# Patient Record
Sex: Male | Born: 1995 | Hispanic: Yes | Marital: Single | Smoking: Never smoker
Health system: Southern US, Community
[De-identification: ages and names within clinical notes are randomized; demographics above are authoritative.]

---

## 2019-09-12 ENCOUNTER — Emergency Department (HOSPITAL_COMMUNITY)
Admission: EM | Admit: 2019-09-12 | Discharge: 2019-09-12 | Disposition: A | Discharge status: Home / Self Care | Payer: Self-pay | Attending: Emergency Medicine | Admitting: Emergency Medicine

## 2019-09-12 ENCOUNTER — Other Ambulatory Visit: Payer: Self-pay

## 2019-09-12 ENCOUNTER — Encounter (HOSPITAL_COMMUNITY): Payer: Self-pay | Admitting: Emergency Medicine

## 2019-09-12 DIAGNOSIS — B081 Molluscum contagiosum: Secondary | ICD-10-CM | POA: Insufficient documentation

## 2019-09-12 LAB — URINALYSIS, ROUTINE W REFLEX MICROSCOPIC
Bilirubin Urine: NEGATIVE
Glucose, UA: NEGATIVE mg/dL
Hgb urine dipstick: NEGATIVE
Ketones, ur: NEGATIVE mg/dL
Leukocytes,Ua: NEGATIVE
Nitrite: NEGATIVE
Protein, ur: NEGATIVE mg/dL
Specific Gravity, Urine: 1.019 (ref 1.005–1.030)
pH: 6 (ref 5.0–8.0)

## 2019-09-12 NOTE — Discharge Instructions (Addendum)
Tiene un virus llamado molusco contagioso que causa la erupcin.  Esto debe ser tratado por un urlogo. No hay ningn medicamento para tratar esto.  Llame al [facility] de [facility] ma[facility] por la maana para programar una cita de seguimiento. Regrese a la sala de emergencias si alguna de las lesiones se infecta.

## 2019-09-12 NOTE — ED Provider Notes (Signed)
[facility] EMERGENCY DEPARTMENT Provider Note   CSN: [id number] Arrival date & time: 09/12/19  2027     History Chief Complaint  Patient presents with  . Exposure to STD    Benjamin Walton is a 24 y.o. male with a hx of major medical problems presents to the Emergency Department complaining of gradual, persistent, progressively worsening penile rash onset several weeks ago.  Patient reports associated itching but no burning or weeping.  He reports that sometimes they open up but there is no fluid that comes out.  Patient has used hydrocortisone cream without relief.  Nothing seems to make it better or worse.  Patient denies dysuria, hematuria, penile discharge.  Patient denies this rash anywhere else on his body.  The history is provided by the patient and medical records. The history is limited by a language barrier. A language interpreter was used.       History reviewed. No pertinent past medical history.  There are no problems to display for this patient.   History reviewed. No pertinent surgical history.     History reviewed. No pertinent family history.  Social History   Tobacco Use  . Smoking status: Never Smoker  . Smokeless tobacco: Never Used  Substance Use Topics  . Alcohol use: Yes  . Drug use: Never    Home Medications Prior to Admission medications   Medication Sig Start Date End Date Taking? Authorizing Provider  hydrocortisone (CORTIZONE-10) 1 % ointment Apply 1 application topically daily as needed for itching.   Yes [provider]    Allergies    Patient has no known allergies.  Review of Systems   Review of Systems  Constitutional: Negative for chills and fever.  Gastrointestinal: Negative for abdominal pain, nausea and vomiting.  Genitourinary: Positive for genital sores. Negative for discharge, dysuria, penile pain, penile swelling, scrotal swelling and testicular pain.  Skin: Positive for rash.    Physical  Exam Updated Vital Signs BP 139/87 (BP Location: Left Arm)   Pulse 74   Temp (!) 97.5 F (36.4 C) (Oral)   Resp 20   Ht 5' 6.93" (1.7 m)   Wt 81.6 kg   SpO2 99%   BMI 28.25 kg/m   Physical Exam Vitals and nursing note reviewed. Exam conducted with a chaperone present.  Constitutional:      General: He is not in acute distress.    Appearance: He is well-developed.  HENT:     Head: Normocephalic.  Eyes:     General: No scleral icterus.    Conjunctiva/sclera: Conjunctivae normal.  Cardiovascular:     Rate and Rhythm: Normal rate.  Pulmonary:     Effort: Pulmonary effort is normal.  Abdominal:     Hernia: There is no hernia in the left inguinal area or right inguinal area.  Genitourinary:    Penis: Uncircumcised. Lesions present. No erythema, tenderness, discharge or swelling.      Testes: Normal. Cremasteric reflex is present.     Epididymis:     Right: Normal.     Left: Normal.     Comments: Numerous, firm, dome-shaped papules to the penis and foreskin.  Some of the papules are umbilicated.  No vesicular lesions, ulcerations, erythema or induration. Musculoskeletal:        General: Normal range of motion.     Cervical back: Normal range of motion.  Lymphadenopathy:     Lower Body: No right inguinal adenopathy. No left inguinal adenopathy.  Skin:  General: Skin is warm and dry.  Neurological:     Mental Status: He is alert.     ED Results / Procedures / Treatments   Labs (all labs ordered are listed, but only abnormal results are displayed) Labs Reviewed  URINALYSIS, ROUTINE W REFLEX MICROSCOPIC    Procedures Procedures (including critical care time)  Medications Ordered in ED Medications - No data to display  ED Course  I have reviewed the triage vital signs and the nursing notes.  Pertinent labs & imaging results that were available during my care of the patient were reviewed by me and considered in my medical decision making (see chart for  details).    MDM Rules/Calculators/A&P                      Patient presents with genital rash.  Clinically consistent with molluscum contagiosum.  Not clinically consistent with herpes.  UA without evidence of urinary tract infection or white blood cells.  Patient without penile discharge.  Low likelihood of gonorrhea or chlamydia.  I do have some concern about potential HIV infection.  I have offered blood work for testing however patient has refused.  He does understand that molluscum can come in combination with HIV without testing we will not know.  He reports he will follow-up.  I have recommended [facility] and urology follow-up for treatment.   Final Clinical Impression(s) / ED Diagnoses Final diagnoses:  Molluscum contagiosum    Rx / DC Orders ED Discharge Orders    None       Rishab Stoudt, Gwenlyn Perking 09/12/19 Mower, [place], DO 09/13/19 2112

## 2019-09-12 NOTE — ED Triage Notes (Signed)
Pt states he is having some rash and burning sensation on his penis for the past 2 days, denies any recent sexual activity, no urinary symptoms.

## 2019-09-12 NOTE — ED Notes (Signed)
Video interpretor used to discharge the patient. Pt had no questions or concerns with discharge. Pt read and understood instructions. Pt conscious, breathing, and A&Ox4. No distress noted. Pt left the ED with a smooth and steady gait.

## 2020-10-09 ENCOUNTER — Ambulatory Visit (HOSPITAL_COMMUNITY)
Admission: EM | Admit: 2020-10-09 | Discharge: 2020-10-09 | Disposition: A | Discharge status: Home / Self Care | Payer: Self-pay | Attending: Urgent Care | Admitting: Urgent Care

## 2020-10-09 ENCOUNTER — Other Ambulatory Visit: Payer: Self-pay

## 2020-10-09 ENCOUNTER — Ambulatory Visit (INDEPENDENT_AMBULATORY_CARE_PROVIDER_SITE_OTHER): Payer: Self-pay

## 2020-10-09 ENCOUNTER — Encounter (HOSPITAL_COMMUNITY): Payer: Self-pay

## 2020-10-09 DIAGNOSIS — M25431 Effusion, right wrist: Secondary | ICD-10-CM

## 2020-10-09 DIAGNOSIS — M25531 Pain in right wrist: Secondary | ICD-10-CM

## 2020-10-09 DIAGNOSIS — S60211A Contusion of right wrist, initial encounter: Secondary | ICD-10-CM

## 2020-10-09 DIAGNOSIS — R2231 Localized swelling, mass and lump, right upper limb: Secondary | ICD-10-CM

## 2020-10-09 MED ORDER — NAPROXEN 500 MG PO TABS: 500.0000 mg | ORAL_TABLET | Freq: Two times a day (BID) | ORAL | 0 refills | Status: AC

## 2020-10-09 NOTE — ED Provider Notes (Signed)
[facility]   MRN: [id number] DOB: [birth year]  Subjective:   Benjamin Walton is a 25 y.o. male presenting for 3-week history of persistent right swelling and intermittent pain.  Symptoms started after patient suffered an accidental fall at home.  He ended up hitting his wrist up against a 4 x 4 post.  He has since had persistent pain and swelling but admittedly has improved.  Has not used any medications for relief.  Patient went to get a consultation with a nonmedical practitioner and was advised to come to our clinic to have it drained.  No current facility-administered medications for this encounter.  Current Outpatient Medications:  .  hydrocortisone (CORTIZONE-10) 1 % ointment, Apply 1 application topically daily as needed for itching., Disp: , Rfl:    No Known Allergies  History reviewed. No pertinent past medical history.   History reviewed. No pertinent surgical history.  History reviewed. No pertinent family history.  Social History   Tobacco Use  . Smoking status: Never Smoker  . Smokeless tobacco: Never Used  Substance Use Topics  . Alcohol use: Yes  . Drug use: Never    ROS   Objective:   Vitals: BP (!) 157/98   Pulse 75   Temp 98.2 F (36.8 C)   Resp 18   SpO2 97%   Physical Exam Constitutional:      General: He is not in acute distress.    Appearance: Normal appearance. He is well-developed and normal weight. He is not ill-appearing, toxic-appearing or diaphoretic.  HENT:     Head: Normocephalic and atraumatic.     Right Ear: External ear normal.     Left Ear: External ear normal.     Nose: Nose normal.     Mouth/Throat:     Pharynx: Oropharynx is clear.  Eyes:     General: No scleral icterus.       Right eye: No discharge.        Left eye: No discharge.     Extraocular Movements: Extraocular movements intact.     Pupils: Pupils are equal, round, and reactive to light.  Cardiovascular:     Rate and Rhythm: Normal rate.   Pulmonary:     Effort: Pulmonary effort is normal.  Musculoskeletal:     Right wrist: Swelling and tenderness (mild over dorsal aspect of his right wrist) present. No deformity, effusion, lacerations, bony tenderness, snuff box tenderness or crepitus. Normal range of motion. Normal pulse.     Cervical back: Normal range of motion.  Skin:    General: Skin is warm and dry.  Neurological:     Mental Status: He is alert and oriented to person, place, and time.  Psychiatric:        Mood and Affect: Mood normal.        Behavior: Behavior normal.        Thought Content: Thought content normal.        Judgment: Judgment normal.     DG Wrist Complete Right  Result Date: 10/09/2020 CLINICAL DATA:  Right wrist pain and swelling for 20 days, trauma EXAM: RIGHT WRIST - COMPLETE 3+ VIEW COMPARISON:  None. FINDINGS: Frontal, oblique, lateral, and ulnar deviated views none the right wrist are obtained. There is severe dorsal soft tissue swelling at the level of the wrist, with underlying soft tissue calcifications. This could reflect sequela of hematoma given history of subacute trauma. I do not see any underlying fracture, subluxation, or dislocation. Joint  spaces are well preserved. IMPRESSION: 1. Marked dorsal soft tissue swelling with associated soft tissue calcification, which may reflect posttraumatic hematoma. No acute displaced fracture. Electronically Signed   By: Sharlet Salina M.D.   On: 10/09/2020 15:06    Assessment and Plan :   PDMP not reviewed this encounter.  1. Right wrist pain   2. Pain and swelling of right wrist   3. Traumatic hematoma of right wrist, initial encounter     Counseled against aspiration and patient was receptive.  As patient has not tried any treatments, recommended naproxen, Ace wrap.  I did a 2 inch Ace wrap over the right wrist to help with the swelling.  Recommended conservative management otherwise. Counseled patient on potential for adverse effects with  medications prescribed/recommended today, ER and return-to-clinic precautions discussed, patient verbalized understanding.    Wallis Bamberg, PA-C 10/09/20 1525

## 2020-10-09 NOTE — ED Triage Notes (Signed)
Pt presents with pain and swelling in the right wrist x 20 days, after hitting the right wrist with a wall. Pt think he is having  A cyst in the right wrist.

## 2021-12-05 IMAGING — DX DG WRIST COMPLETE 3+V*R*
4 series · 4 of 4 positions shown · non-contrast
Comparison: None.

CLINICAL DATA: Right wrist pain and swelling for 20 days, trauma

EXAM:
RIGHT WRIST - COMPLETE 3+ VIEW

[wrist pa]
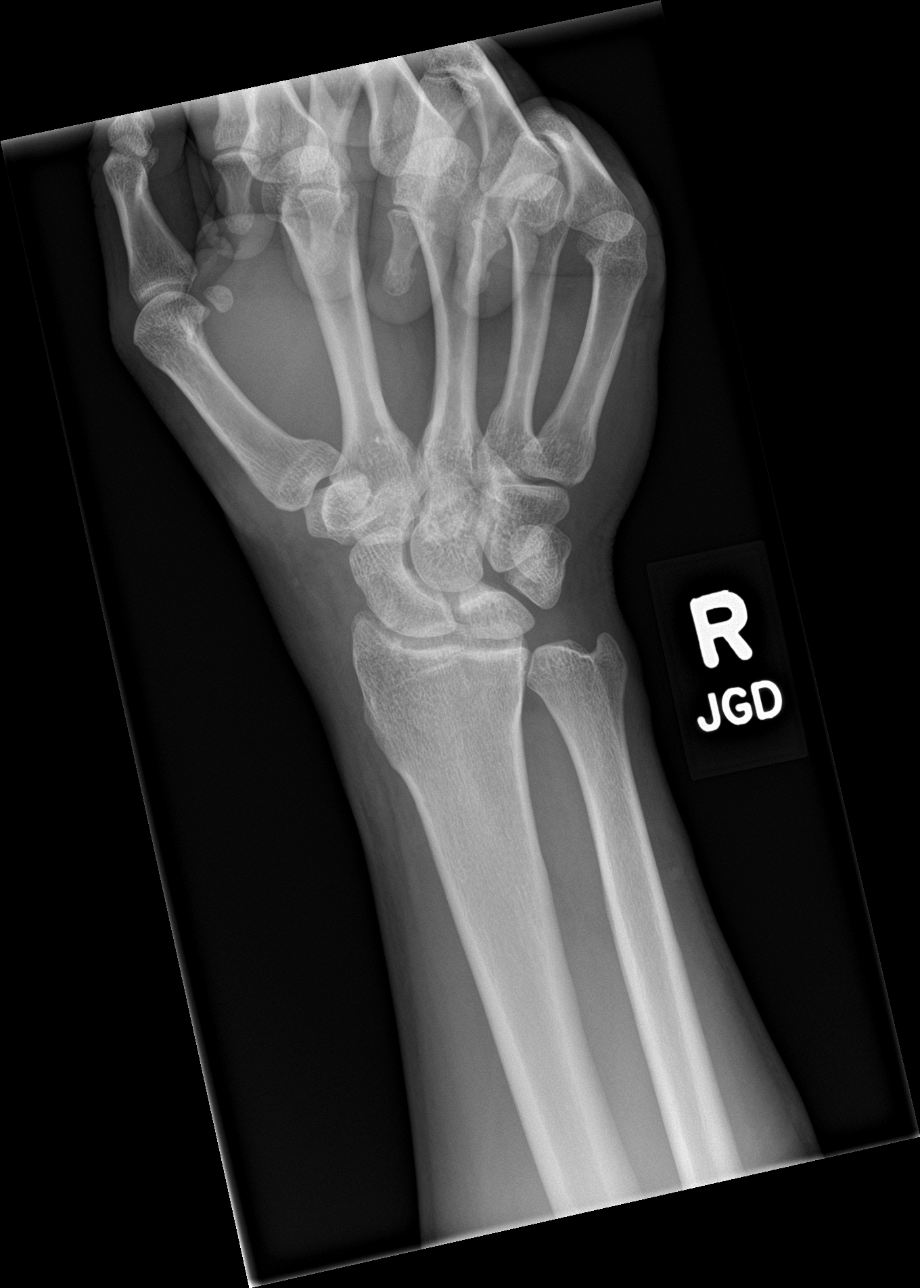

[wrist navicular]
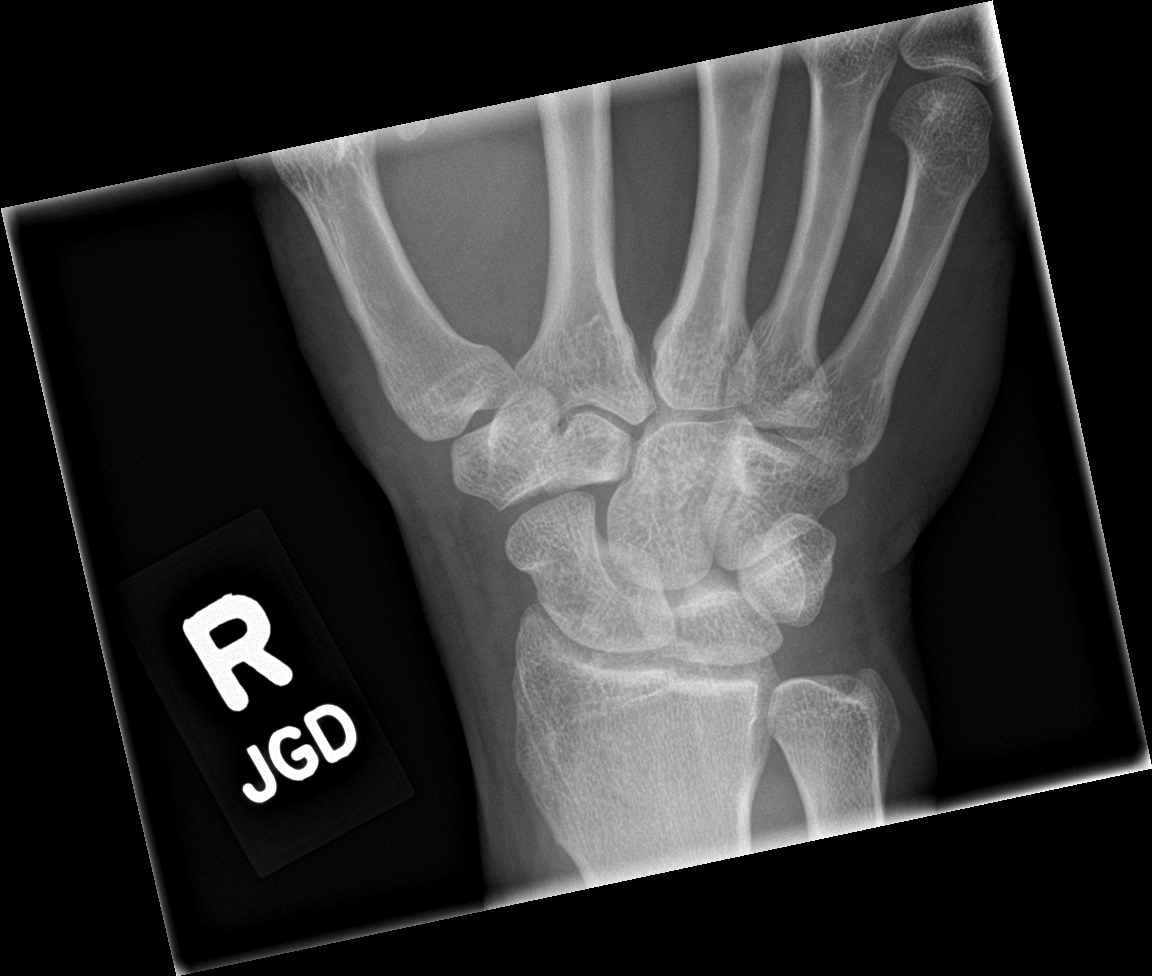

[wrist obl]
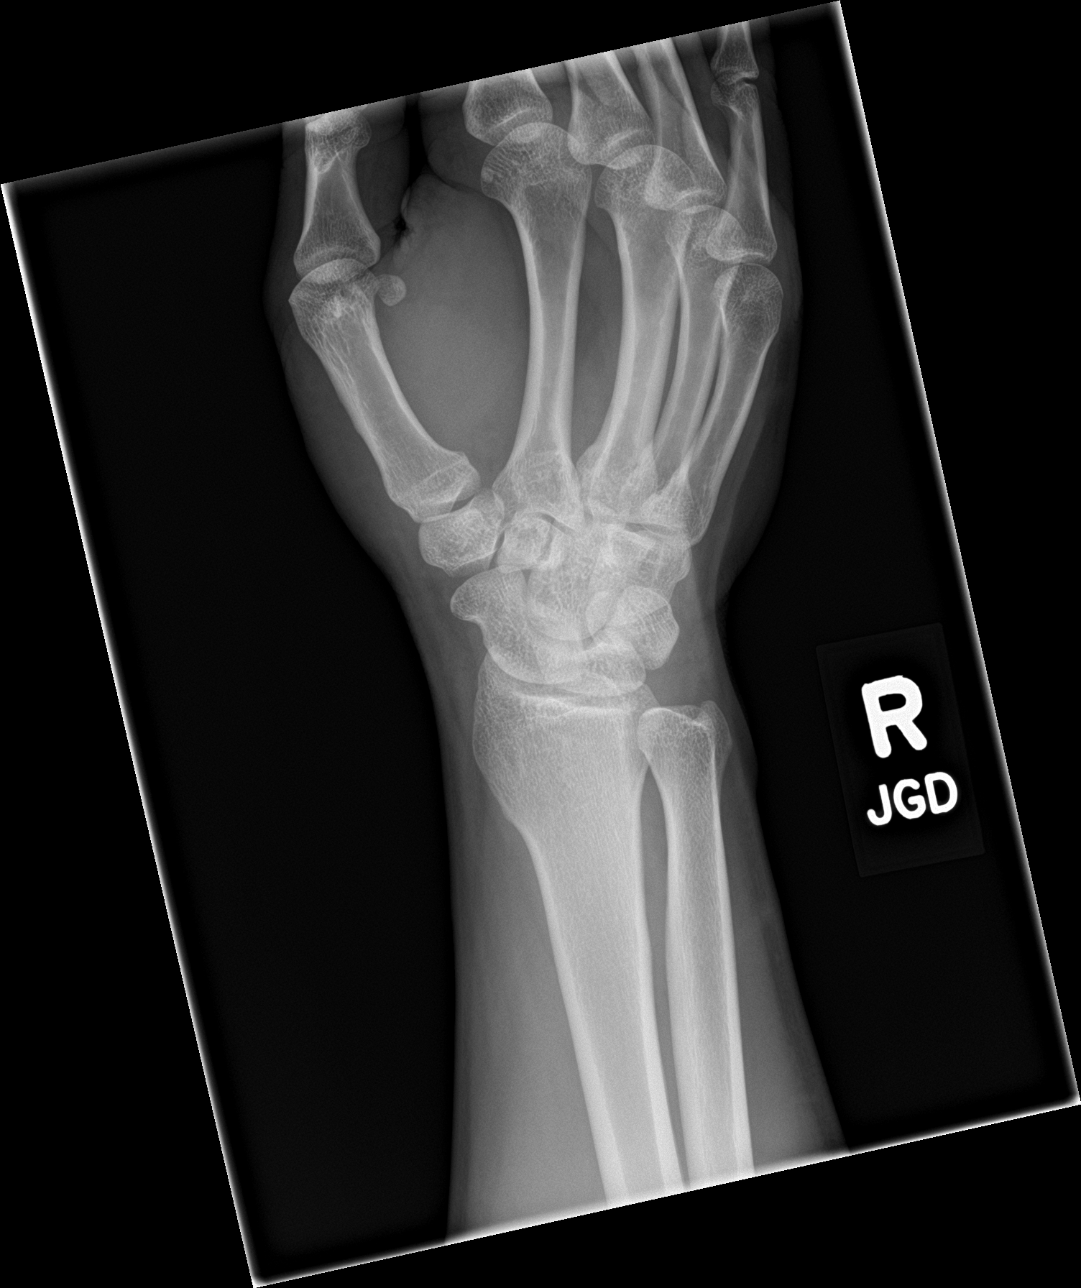

[wrist lat]
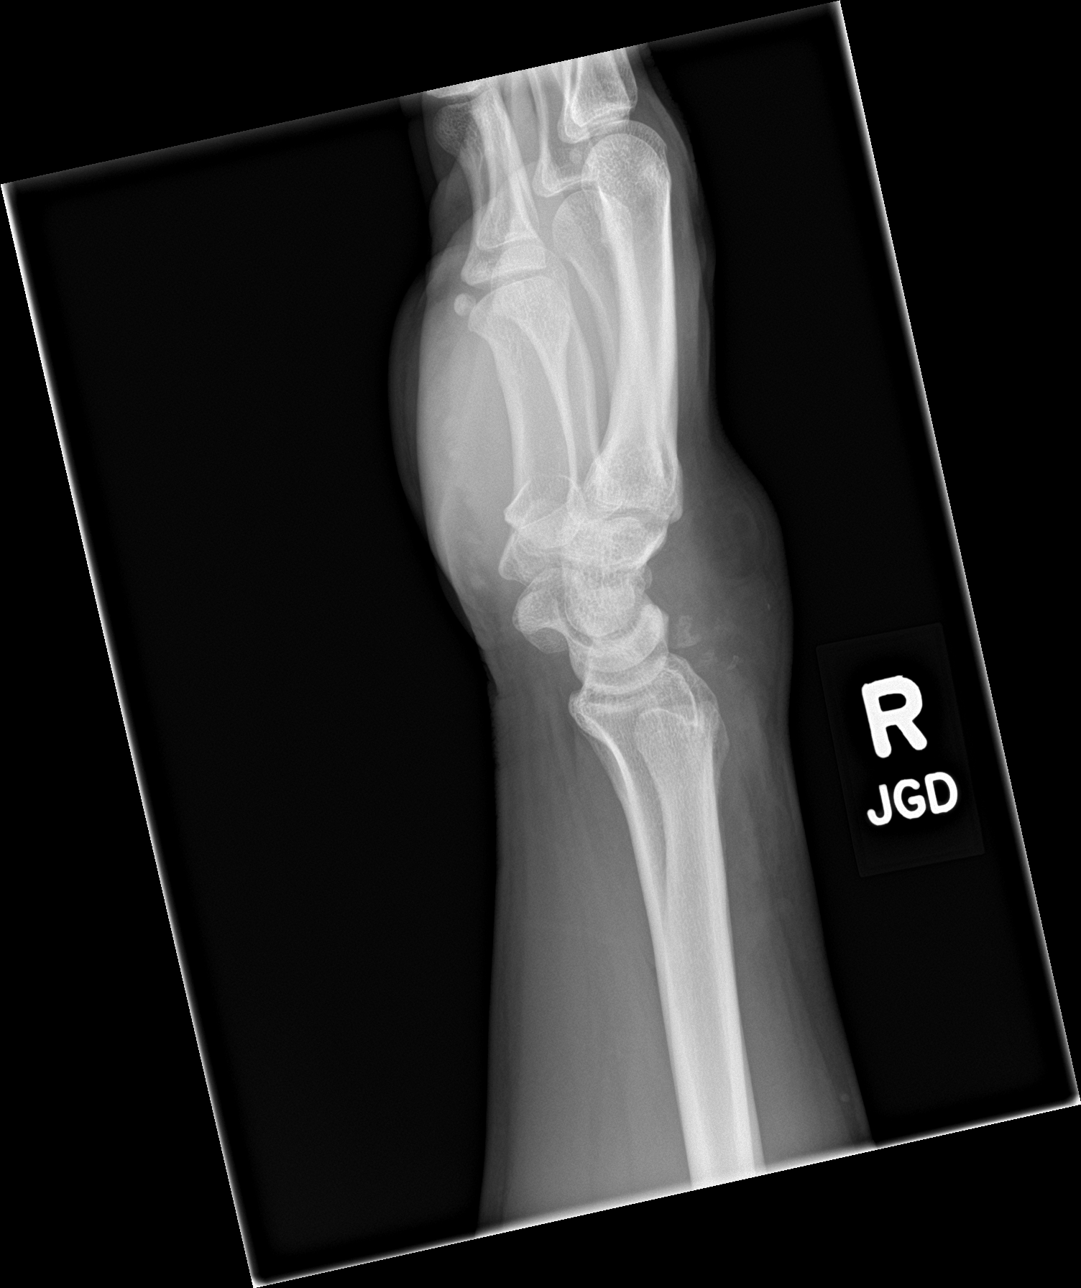

[4 of 4 positions shown; findings below may reference images not displayed]

FINDINGS: Frontal, oblique, lateral, and ulnar deviated views none the right
wrist are obtained. There is severe dorsal soft tissue swelling at
the level of the wrist, with underlying soft tissue calcifications.
This could reflect sequela of hematoma given history of subacute
trauma. I do not see any underlying fracture, subluxation, or
dislocation. Joint spaces are well preserved.
IMPRESSION: 1. Marked dorsal soft tissue swelling with associated soft tissue
calcification, which may reflect posttraumatic hematoma. No acute
displaced fracture.
